# Patient Record
Sex: Male | Born: 1965 | Race: Black or African American | Hispanic: No | Marital: Single | Smoking: Former smoker
Health system: Southern US, Community
[De-identification: ages and names within clinical notes are randomized; demographics above are authoritative.]

---

## 1997-10-19 ENCOUNTER — Emergency Department (HOSPITAL_COMMUNITY): Admission: EM | Admit: 1997-10-19 | Discharge: 1997-10-19 | Payer: Self-pay | Admitting: Emergency Medicine

## 1999-12-02 ENCOUNTER — Emergency Department (HOSPITAL_COMMUNITY): Admission: EM | Admit: 1999-12-02 | Discharge: 1999-12-02 | Payer: Self-pay | Admitting: *Deleted

## 2002-11-02 ENCOUNTER — Emergency Department (HOSPITAL_COMMUNITY): Admission: EM | Admit: 2002-11-02 | Discharge: 2002-11-02 | Payer: Self-pay | Admitting: Emergency Medicine

## 2003-07-01 ENCOUNTER — Emergency Department (HOSPITAL_COMMUNITY): Admission: EM | Admit: 2003-07-01 | Discharge: 2003-07-01 | Payer: Self-pay | Admitting: Family Medicine

## 2004-01-10 ENCOUNTER — Emergency Department (HOSPITAL_COMMUNITY): Admission: EM | Admit: 2004-01-10 | Discharge: 2004-01-10 | Payer: Self-pay | Admitting: Emergency Medicine

## 2004-05-04 ENCOUNTER — Ambulatory Visit: Payer: Self-pay | Admitting: Internal Medicine

## 2004-05-04 ENCOUNTER — Inpatient Hospital Stay (HOSPITAL_COMMUNITY): Admission: EM | Admit: 2004-05-04 | Discharge: 2004-05-06 | Payer: Self-pay | Admitting: Emergency Medicine

## 2004-05-06 ENCOUNTER — Inpatient Hospital Stay (HOSPITAL_COMMUNITY): Admission: AD | Admit: 2004-05-06 | Discharge: 2004-05-09 | Payer: Self-pay | Admitting: Psychiatry

## 2004-05-06 ENCOUNTER — Ambulatory Visit: Payer: Self-pay | Admitting: Psychiatry

## 2005-03-21 ENCOUNTER — Emergency Department (HOSPITAL_COMMUNITY): Admission: EM | Admit: 2005-03-21 | Discharge: 2005-03-21 | Payer: Self-pay | Admitting: Emergency Medicine

## 2005-04-05 ENCOUNTER — Emergency Department (HOSPITAL_COMMUNITY): Admission: EM | Admit: 2005-04-05 | Discharge: 2005-04-05 | Payer: Self-pay | Admitting: Emergency Medicine

## 2005-04-09 ENCOUNTER — Emergency Department (HOSPITAL_COMMUNITY): Admission: EM | Admit: 2005-04-09 | Discharge: 2005-04-09 | Payer: Self-pay | Admitting: Emergency Medicine

## 2005-06-13 ENCOUNTER — Encounter: Payer: Self-pay | Admitting: Emergency Medicine

## 2007-08-15 ENCOUNTER — Emergency Department (HOSPITAL_COMMUNITY): Admission: EM | Admit: 2007-08-15 | Discharge: 2007-08-16 | Payer: Self-pay | Admitting: Emergency Medicine

## 2009-03-26 ENCOUNTER — Emergency Department: Payer: Self-pay | Admitting: Emergency Medicine

## 2009-04-03 ENCOUNTER — Emergency Department: Payer: Self-pay | Admitting: Unknown Physician Specialty
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
## 2010-11-09 LAB — CBC
HCT: 45.5
Hemoglobin: 15.4
MCHC: 33.8
MCV: 86.1
Platelets: 217
RBC: 5.29
RDW: 14.4
WBC: 6.6

## 2010-11-09 LAB — DIFFERENTIAL
Basophils Absolute: 0
Basophils Relative: 1
Eosinophils Relative: 3
Monocytes Absolute: 0.3
Monocytes Relative: 5

## 2010-11-09 LAB — URINALYSIS, ROUTINE W REFLEX MICROSCOPIC
Bilirubin Urine: NEGATIVE
Hgb urine dipstick: NEGATIVE
Nitrite: NEGATIVE
Protein, ur: NEGATIVE
Specific Gravity, Urine: 1.029
Urobilinogen, UA: 1

## 2010-11-09 LAB — RAPID URINE DRUG SCREEN, HOSP PERFORMED
Amphetamines: NOT DETECTED
Barbiturates: NOT DETECTED
Benzodiazepines: NOT DETECTED
Opiates: NOT DETECTED
Tetrahydrocannabinol: NOT DETECTED

## 2010-11-09 LAB — COMPREHENSIVE METABOLIC PANEL
AST: 25
Albumin: 4.6
Alkaline Phosphatase: 58
Chloride: 106
GFR calc Af Amer: >60
Potassium: 4.2
Sodium: 139
Total Bilirubin: 2.2 — ABNORMAL HIGH
Total Protein: 8

## 2010-11-09 LAB — ETHANOL: Alcohol, Ethyl (B): <5

## 2010-11-09 LAB — ACETAMINOPHEN LEVEL: Acetaminophen (Tylenol), Serum: <10 — ABNORMAL LOW

## 2014-05-04 ENCOUNTER — Emergency Department (HOSPITAL_COMMUNITY)
Admission: EM | Admit: 2014-05-04 | Discharge: 2014-05-04 | Disposition: A | Discharge status: Home / Self Care | Payer: Non-veteran care | Attending: Emergency Medicine | Admitting: Emergency Medicine

## 2014-05-04 ENCOUNTER — Emergency Department (HOSPITAL_COMMUNITY): Payer: Non-veteran care

## 2014-05-04 ENCOUNTER — Encounter (HOSPITAL_COMMUNITY): Payer: Self-pay | Admitting: Emergency Medicine

## 2014-05-04 DIAGNOSIS — S023XXA Fracture of orbital floor, initial encounter for closed fracture: Secondary | ICD-10-CM | POA: Insufficient documentation

## 2014-05-04 DIAGNOSIS — Y929 Unspecified place or not applicable: Secondary | ICD-10-CM | POA: Insufficient documentation

## 2014-05-04 DIAGNOSIS — S022XXA Fracture of nasal bones, initial encounter for closed fracture: Secondary | ICD-10-CM | POA: Insufficient documentation

## 2014-05-04 DIAGNOSIS — Y998 Other external cause status: Secondary | ICD-10-CM | POA: Insufficient documentation

## 2014-05-04 DIAGNOSIS — S0231XA Fracture of orbital floor, right side, initial encounter for closed fracture: Secondary | ICD-10-CM

## 2014-05-04 DIAGNOSIS — Z87891 Personal history of nicotine dependence: Secondary | ICD-10-CM | POA: Insufficient documentation

## 2014-05-04 DIAGNOSIS — S0011XA Contusion of right eyelid and periocular area, initial encounter: Secondary | ICD-10-CM | POA: Insufficient documentation

## 2014-05-04 DIAGNOSIS — Y9389 Activity, other specified: Secondary | ICD-10-CM | POA: Insufficient documentation

## 2014-05-04 MED ORDER — HYDROCODONE-ACETAMINOPHEN 5-325 MG PO TABS: 1.0000 | ORAL_TABLET | ORAL | Status: AC | PRN

## 2014-05-04 MED ORDER — AMOXICILLIN-POT CLAVULANATE 875-125 MG PO TABS: 1.0000 | ORAL_TABLET | Freq: Two times a day (BID) | ORAL | Status: AC

## 2014-05-04 NOTE — ED Notes (Signed)
Pt requested OJ on ice and was given the same
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
## 2014-05-04 NOTE — Discharge Instructions (Signed)
Take Augmentin until gone. Take Vicodin as needed for pain. Follow up with the recommended Ophthalmologist and ENT doctor. Refer to attached documents for more information.
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
## 2019-09-18 ENCOUNTER — Encounter (HOSPITAL_COMMUNITY): Payer: Self-pay

## 2019-09-18 ENCOUNTER — Other Ambulatory Visit: Payer: Self-pay

## 2019-09-18 ENCOUNTER — Emergency Department (HOSPITAL_COMMUNITY)
Admission: EM | Admit: 2019-09-18 | Discharge: 2019-09-18 | Disposition: A | Discharge status: Home / Self Care | Payer: No Typology Code available for payment source | Attending: Emergency Medicine | Admitting: Emergency Medicine

## 2019-09-18 DIAGNOSIS — R319 Hematuria, unspecified: Secondary | ICD-10-CM | POA: Insufficient documentation

## 2019-09-18 DIAGNOSIS — Z5321 Procedure and treatment not carried out due to patient leaving prior to being seen by health care provider: Secondary | ICD-10-CM | POA: Insufficient documentation

## 2019-09-18 LAB — URINALYSIS, ROUTINE W REFLEX MICROSCOPIC
Bilirubin Urine: NEGATIVE
Glucose, UA: NEGATIVE mg/dL
Ketones, ur: NEGATIVE mg/dL
Nitrite: NEGATIVE
Protein, ur: NEGATIVE mg/dL
Specific Gravity, Urine: 1.019 (ref 1.005–1.030)
WBC, UA: >50 WBC/hpf — ABNORMAL HIGH (ref 0–5)
pH: 6 (ref 5.0–8.0)

## 2019-09-18 NOTE — ED Triage Notes (Addendum)
Pt arrives to ED w/ c/o hematuria that started on Sunday. Pt denies back/flank pain. Pt denies injury/trauma to genitals.

## 2019-09-18 NOTE — ED Notes (Signed)
Pt left due to not being seen quick enough 

## 2019-09-20 ENCOUNTER — Other Ambulatory Visit: Payer: Self-pay

## 2019-09-20 ENCOUNTER — Encounter (HOSPITAL_COMMUNITY): Payer: Self-pay

## 2019-09-20 ENCOUNTER — Ambulatory Visit (HOSPITAL_COMMUNITY)
Admission: EM | Admit: 2019-09-20 | Discharge: 2019-09-20 | Disposition: A | Discharge status: Home / Self Care | Payer: Medicare Other | Attending: Emergency Medicine | Admitting: Emergency Medicine

## 2019-09-20 DIAGNOSIS — N341 Nonspecific urethritis: Secondary | ICD-10-CM | POA: Diagnosis not present

## 2019-09-20 DIAGNOSIS — R319 Hematuria, unspecified: Secondary | ICD-10-CM | POA: Diagnosis not present

## 2019-09-20 DIAGNOSIS — J029 Acute pharyngitis, unspecified: Secondary | ICD-10-CM | POA: Diagnosis not present

## 2019-09-20 LAB — POCT URINALYSIS DIPSTICK, ED / UC
Glucose, UA: NEGATIVE mg/dL
Nitrite: NEGATIVE
Protein, ur: 30 mg/dL — AB
Specific Gravity, Urine: >=1.03 (ref 1.005–1.030)
Urobilinogen, UA: 0.2 mg/dL (ref 0.0–1.0)
pH: 5 (ref 5.0–8.0)

## 2019-09-20 LAB — URINE CULTURE: Culture: NO GROWTH

## 2019-09-20 LAB — POCT RAPID STREP A, ED / UC: Streptococcus, Group A Screen (Direct): NEGATIVE

## 2019-09-20 MED ORDER — CEFTRIAXONE SODIUM 1 G IJ SOLR
1.0000 g | Freq: Once | INTRAMUSCULAR | Status: AC
  Administered 2019-09-20: 1 g via INTRAMUSCULAR

## 2019-09-20 MED ORDER — LIDOCAINE HCL (PF) 1 % IJ SOLN
INTRAMUSCULAR | Status: AC
  Filled 2019-09-20: qty 2

## 2019-09-20 MED ORDER — AZITHROMYCIN 250 MG PO TABS
ORAL_TABLET | ORAL | Status: AC
Start: 2019-09-20 — End: ?
  Filled 2019-09-20: qty 4

## 2019-09-20 MED ORDER — CEFTRIAXONE SODIUM 1 G IJ SOLR
INTRAMUSCULAR | Status: AC
  Filled 2019-09-20: qty 10

## 2019-09-20 MED ORDER — AZITHROMYCIN 250 MG PO TABS
1000.0000 mg | ORAL_TABLET | Freq: Once | ORAL | Status: AC
  Administered 2019-09-20: 1000 mg via ORAL

## 2019-09-20 MED ORDER — MAGIC MOUTHWASH W/LIDOCAINE: 5.0000 mL | Freq: Four times a day (QID) | ORAL | 0 refills | Status: AC | PRN

## 2019-09-20 NOTE — ED Provider Notes (Signed)
[facility]    CSN: [id number] Arrival date & time: 09/20/19  1337      History   Chief Complaint Chief Complaint  Patient presents with  . Sore Throat  . Hematuria    HPI [name] is a 54 y.o. male. He reports sore throat that began about 3 days ago. Pain is right sided. Reports painful to swallow. Pain radiates up to her right ear and right neck. Denies fever, chills, change in voices, difficulty with breathing.   Also reports Hematuria for 1 weeks. Reports noting bright red blood in his urine and sometimes at the end of urination. Also noted purulent urethral discharge 3 days ago. Uncertain if he has had a positive STD contact   HPI  History reviewed. No pertinent past medical history.  There are no problems to display for this patient.   History reviewed. No pertinent surgical history.     Home Medications    Prior to Admission medications   Medication Sig Start Date End Date Taking? Authorizing Provider  amoxicillin-clavulanate (AUGMENTIN) 875-125 MG per tablet Take 1 tablet by mouth every 12 (twelve) hours. 05/04/14   Emilia Beck, PA-C  HYDROcodone-acetaminophen (NORCO/VICODIN) 5-325 MG per tablet Take 1-2 tablets by mouth every 4 (four) hours as needed. 05/04/14   Emilia Beck, PA-C  magic mouthwash w/lidocaine SOLN Take 5 mLs by mouth 4 (four) times daily as needed for mouth pain. 09/20/19   Chilton Si, PA-C    Family History History reviewed. No pertinent family history.  Social History Social History   Tobacco Use  . Smoking status: Former Smoker  Substance Use Topics  . Alcohol use: No  . Drug use: No     Allergies   Patient has no known allergies.   Review of Systems Review of Systems  Constitutional: Negative for chills and fever.  HENT: Positive for sore throat and trouble swallowing. Negative for postnasal drip, sinus pain and voice change.   Respiratory: Negative for cough, shortness of breath and  wheezing.   Cardiovascular: Negative for chest pain.  Gastrointestinal: Negative for abdominal pain, nausea and vomiting.  Genitourinary: Positive for discharge, dysuria, frequency and hematuria. Negative for difficulty urinating and penile pain.  Musculoskeletal: Negative for myalgias, neck pain and neck stiffness.  Neurological: Negative for dizziness and headaches.  All other systems reviewed and are negative.    Physical Exam Triage Vital Signs ED Triage Vitals  Enc Vitals Group     BP 09/20/19 1518 (!) 137/92     Pulse Rate 09/20/19 1518 64     Resp 09/20/19 1518 16     Temp 09/20/19 1518 98.9 F (37.2 C)     Temp Source 09/20/19 1518 Oral     SpO2 09/20/19 1518 100 %     Weight --      Height --      Head Circumference --      Peak Flow --      Pain Score 09/20/19 1521 8     Pain Loc --      Pain Edu? --      Excl. in GC? --    No data found.  Updated Vital Signs BP (!) 137/92 (BP Location: Right Arm)   Pulse 64   Temp 98.9 F (37.2 C) (Oral)   Resp 16   SpO2 100%   Visual Acuity Right Eye Distance:   Left Eye Distance:   Bilateral Distance:    Right Eye Near:  Left Eye Near:    Bilateral Near:     Physical Exam Vitals reviewed.  Constitutional:      General: He is not in acute distress.    Appearance: He is well-developed and normal weight. He is not ill-appearing.  HENT:     Head: Normocephalic and atraumatic.     Right Ear: Tympanic membrane normal. There is impacted cerumen.     Left Ear: Tympanic membrane normal. There is impacted cerumen.     Nose: No congestion or rhinorrhea.     Mouth/Throat:     Mouth: Mucous membranes are moist.     Pharynx: No oropharyngeal exudate or posterior oropharyngeal erythema.     Tonsils: No tonsillar exudate or tonsillar abscesses.  Eyes:     Extraocular Movements:     Right eye: Normal extraocular motion.     Left eye: Normal extraocular motion.     Conjunctiva/sclera: Conjunctivae normal.     Pupils:  Pupils are equal, round, and reactive to light.  Neck:     Thyroid: No thyromegaly.  Cardiovascular:     Rate and Rhythm: Normal rate and regular rhythm.     Heart sounds: Normal heart sounds.  Pulmonary:     Effort: Pulmonary effort is normal.     Breath sounds: Normal breath sounds.  Abdominal:     General: Bowel sounds are normal. There is no distension.     Palpations: Abdomen is soft. There is no mass.  Musculoskeletal:     Cervical back: Normal range of motion and neck supple.  Lymphadenopathy:     Cervical: Cervical adenopathy present.  Skin:    General: Skin is warm.     Coloration: Skin is not pale.  Neurological:     Mental Status: He is alert.  Psychiatric:        Mood and Affect: Mood normal.        Behavior: Behavior normal.      UC Treatments / Results  Labs (all labs ordered are listed, but only abnormal results are displayed) Labs Reviewed  POCT URINALYSIS DIPSTICK, ED / UC - Abnormal; Notable for the following components:      Result Value   Bilirubin Urine SMALL (*)    Ketones, ur TRACE (*)    Hgb urine dipstick SMALL (*)    Protein, ur 30 (*)    Leukocytes,Ua SMALL (*)    All other components within normal limits  CULTURE, GROUP A STREP [facility])  POCT RAPID STREP A, ED / UC  CYTOLOGY, (ORAL, ANAL, URETHRAL) ANCILLARY ONLY    EKG   Radiology No results found.  Procedures Procedures (including critical care time)  Medications Ordered in UC Medications  azithromycin (ZITHROMAX) tablet 1,000 mg (has no administration in time range)  cefTRIAXone (ROCEPHIN) injection 1 g (has no administration in time range)    Initial Impression / Assessment and Plan / UC Course  I have reviewed the triage vital signs and the nursing notes.  Pertinent labs & imaging results that were available during my care of the patient were reviewed by me and considered in my medical decision making (see chart for details).     Pharyngitis and Urethritis:Symptoms and  exam consistent with urethritis. I treated him with azithromax and rocephin. Advised this will also help against a bacterial pharyngitis as well . Advised if he develops shortness of breath he should go immediately to the ER to ensure no tonsillar abscess. Pt voices understanding and agrees with the plan  Final Clinical Impressions(s) / UC Diagnoses   Final diagnoses:  Pharyngitis, unspecified etiology  Urethritis, nonspecific     Discharge Instructions     We will call you with the lab results. You have been treated with rocephin and azithromycin today. This should help your sore throat as well. If your symptoms worsen please f/u with the ER for further evaluation    ED Prescriptions    Medication Sig Dispense Auth. Provider   magic mouthwash w/lidocaine SOLN Take 5 mLs by mouth 4 (four) times daily as needed for mouth pain. 200 mL Chilton Si, PA-C     PDMP not reviewed this encounter.   Chilton Si, PA-C 09/20/19 1720

## 2019-09-20 NOTE — Discharge Instructions (Addendum)
We will call you with the lab results. You have been treated with rocephin and azithromycin today. This should help your sore throat as well. If your symptoms worsen please f/u with the ER for further evaluation

## 2019-09-20 NOTE — ED Triage Notes (Signed)
Pt presents to UC for subjective swollen neck, and possible swollen lymph nodes. Pt states it is painful to swallow. Pt states symptoms have been present x 3 days.   Pt also states he has had blood in his urine x7 days. Pt denies painful urination, burning, or lower abdominal pain.   Pt denies fevers, chills, n/v/d, cough, loss of taste or smell, headache, body aches, nasal congestion.

## 2019-09-21 LAB — CYTOLOGY, (ORAL, ANAL, URETHRAL) ANCILLARY ONLY
Chlamydia: NEGATIVE
Comment: NEGATIVE
Comment: NEGATIVE
Comment: NORMAL
Neisseria Gonorrhea: POSITIVE — AB
Trichomonas: POSITIVE — AB

## 2019-09-23 ENCOUNTER — Telehealth (HOSPITAL_COMMUNITY): Payer: Self-pay

## 2019-09-23 DIAGNOSIS — A599 Trichomoniasis, unspecified: Secondary | ICD-10-CM

## 2019-09-23 LAB — CULTURE, GROUP A STREP (THRC)

## 2019-09-23 MED ORDER — METRONIDAZOLE 500 MG PO TABS: 500.0000 mg | ORAL_TABLET | Freq: Two times a day (BID) | ORAL | 0 refills | Status: AC

## 2019-09-23 NOTE — Telephone Encounter (Signed)
Patient contacted by phone and made aware of    results. Pt verbalized understanding and had all questions answered.   Test for gonorrhea was positive. This was treated at the urgent care visit with IM rocephin 500mg . Test for trichomonas was positive.  Flagyl 500mg  BID  x 7 days sent to pharmacy of choice.  Please refrain from sexual intercourse for 7 days after treatment to give the medicine time to work. Sexual partners need to be notified and tested/treated. Condoms may reduce risk of reinfection. Recheck or followup with PCP for further evaluation if symptoms are not improving. [facility] notified.

## 2019-09-24 ENCOUNTER — Encounter (HOSPITAL_COMMUNITY): Payer: Self-pay

## 2019-09-24 ENCOUNTER — Emergency Department (HOSPITAL_COMMUNITY): Payer: No Typology Code available for payment source

## 2019-09-24 ENCOUNTER — Ambulatory Visit (HOSPITAL_COMMUNITY)
Admission: EM | Admit: 2019-09-24 | Discharge: 2019-09-24 | Disposition: A | Discharge status: Home / Self Care | Payer: No Typology Code available for payment source | Attending: Family Medicine | Admitting: Family Medicine

## 2019-09-24 ENCOUNTER — Other Ambulatory Visit: Payer: Self-pay

## 2019-09-24 ENCOUNTER — Encounter (HOSPITAL_COMMUNITY): Payer: Self-pay | Admitting: Emergency Medicine

## 2019-09-24 ENCOUNTER — Emergency Department (HOSPITAL_COMMUNITY)
Admission: EM | Admit: 2019-09-24 | Discharge: 2019-09-25 | Disposition: A | Discharge status: Home / Self Care | Payer: No Typology Code available for payment source | Attending: Emergency Medicine | Admitting: Emergency Medicine

## 2019-09-24 DIAGNOSIS — Z87891 Personal history of nicotine dependence: Secondary | ICD-10-CM | POA: Insufficient documentation

## 2019-09-24 DIAGNOSIS — J36 Peritonsillar abscess: Secondary | ICD-10-CM

## 2019-09-24 DIAGNOSIS — J029 Acute pharyngitis, unspecified: Secondary | ICD-10-CM | POA: Diagnosis present

## 2019-09-24 DIAGNOSIS — L539 Erythematous condition, unspecified: Secondary | ICD-10-CM | POA: Diagnosis not present

## 2019-09-24 LAB — CBC WITH DIFFERENTIAL/PLATELET
Abs Immature Granulocytes: 0.04 10*3/uL (ref 0.00–0.07)
Basophils Absolute: 0.1 10*3/uL (ref 0.0–0.1)
Basophils Relative: 1 %
Eosinophils Absolute: 0.1 10*3/uL (ref 0.0–0.5)
Eosinophils Relative: 0 %
HCT: 46.7 % (ref 39.0–52.0)
Hemoglobin: 14 g/dL (ref 13.0–17.0)
Immature Granulocytes: 0 %
Lymphocytes Relative: 11 %
Lymphs Abs: 1.3 10*3/uL (ref 0.7–4.0)
MCH: 27.5 pg (ref 26.0–34.0)
MCHC: 30 g/dL (ref 30.0–36.0)
MCV: 91.7 fL (ref 80.0–100.0)
Monocytes Absolute: 1.1 10*3/uL — ABNORMAL HIGH (ref 0.1–1.0)
Monocytes Relative: 9 %
Neutro Abs: 9.7 10*3/uL — ABNORMAL HIGH (ref 1.7–7.7)
Neutrophils Relative %: 79 %
Platelets: 346 10*3/uL (ref 150–400)
RBC: 5.09 MIL/uL (ref 4.22–5.81)
RDW: 13.5 % (ref 11.5–15.5)
WBC: 12.2 10*3/uL — ABNORMAL HIGH (ref 4.0–10.5)
nRBC: 0 % (ref 0.0–0.2)

## 2019-09-24 LAB — I-STAT CHEM 8, ED
BUN: 15 mg/dL (ref 6–20)
Calcium, Ion: 1.1 mmol/L — ABNORMAL LOW (ref 1.15–1.40)
Chloride: 105 mmol/L (ref 98–111)
Creatinine, Ser: 0.8 mg/dL (ref 0.61–1.24)
Glucose, Bld: 122 mg/dL — ABNORMAL HIGH (ref 70–99)
HCT: 43 % (ref 39.0–52.0)
Hemoglobin: 14.6 g/dL (ref 13.0–17.0)
Potassium: 3.6 mmol/L (ref 3.5–5.1)
Sodium: 139 mmol/L (ref 135–145)
TCO2: 24 mmol/L (ref 22–32)

## 2019-09-24 MED ORDER — SODIUM CHLORIDE 0.9 % IV BOLUS
1000.0000 mL | Freq: Once | INTRAVENOUS | Status: AC
  Administered 2019-09-24: 1000 mL via INTRAVENOUS

## 2019-09-24 MED ORDER — CLINDAMYCIN HCL 300 MG PO CAPS: 300.0000 mg | ORAL_CAPSULE | Freq: Three times a day (TID) | ORAL | 0 refills | Status: AC

## 2019-09-24 MED ORDER — SODIUM CHLORIDE 0.9 % IV SOLN: 2.0000 g | Freq: Once | INTRAVENOUS | Status: DC

## 2019-09-24 MED ORDER — DEXAMETHASONE SODIUM PHOSPHATE 10 MG/ML IJ SOLN
10.0000 mg | Freq: Once | INTRAMUSCULAR | Status: AC
  Administered 2019-09-24: 10 mg via INTRAVENOUS
  Filled 2019-09-24: qty 1

## 2019-09-24 MED ORDER — IOHEXOL 300 MG/ML  SOLN
75.0000 mL | Freq: Once | INTRAMUSCULAR | Status: AC | PRN
  Administered 2019-09-24: 75 mL via INTRAVENOUS

## 2019-09-24 MED ORDER — CLINDAMYCIN PHOSPHATE 900 MG/50ML IV SOLN
900.0000 mg | Freq: Once | INTRAVENOUS | Status: AC
  Administered 2019-09-24: 900 mg via INTRAVENOUS
  Filled 2019-09-24: qty 50

## 2019-09-24 NOTE — Discharge Instructions (Addendum)
It was our pleasure to provide your ER care today - we hope that you feel better.  We discussed your case with our ENT doctor on call, Dr Pollyann Kennedy - he indicates for you to follow up with them in  the office this morning - call office at 8 AM to arrange time of appointment - when you call, tell them that you were in the ER tonight, and that we discussed your case with Dr Pollyann Kennedy and that he wants you to be seen in the office this AM.   You will need to continue to take the antibiotics that were prescribed to you at Urgent care.  You will also need to start a new antibiotic tomorrow and will need to continue this until completed.  You can take Tylenol for discomfort as needed.  Return to ER right away if worse, unable to swallow, trouble breathing, or other concern.

## 2019-09-24 NOTE — ED Provider Notes (Signed)
[facility]    CSN: [id number] Arrival date & time: 09/24/19  1047      History   Chief Complaint Chief Complaint  Patient presents with  . Oral Swelling    HPI [name] is a 54 y.o. male.   HPI  Patient has a persistent sore throat and states that he can hardly swallow. He is unable to swallow his Augmentin Side of his face and throat are swelling  History reviewed. No pertinent past medical history.  There are no problems to display for this patient.   History reviewed. No pertinent surgical history.     Home Medications    Prior to Admission medications   Medication Sig Start Date End Date Taking? Authorizing Provider  amoxicillin-clavulanate (AUGMENTIN) 875-125 MG per tablet Take 1 tablet by mouth every 12 (twelve) hours. 05/04/14   Emilia Beck, PA-C  HYDROcodone-acetaminophen (NORCO/VICODIN) 5-325 MG per tablet Take 1-2 tablets by mouth every 4 (four) hours as needed. 05/04/14   Emilia Beck, PA-C  magic mouthwash w/lidocaine SOLN Take 5 mLs by mouth 4 (four) times daily as needed for mouth pain. 09/20/19   Chilton Si, PA-C  metroNIDAZOLE (FLAGYL) 500 MG tablet Take 1 tablet (500 mg total) by mouth 2 (two) times daily. 09/23/19   LampteyBritta Mccreedy, MD    Family History Family History  Problem Relation Age of Onset  . Healthy Mother   . Healthy Father     Social History Social History   Tobacco Use  . Smoking status: Former Smoker  Substance Use Topics  . Alcohol use: Yes    Comment: ocassional   . Drug use: No     Allergies   Patient has no known allergies.   Review of Systems Review of Systems  See HPI Physical Exam Triage Vital Signs ED Triage Vitals  Enc Vitals Group     BP 09/24/19 1230 138/74     Pulse Rate 09/24/19 1230 64     Resp 09/24/19 1230 17     Temp 09/24/19 1230 98.4 F (36.9 C)     Temp Source 09/24/19 1230 Oral     SpO2 09/24/19 1230 99 %     Weight --      Height --      Head  Circumference --      Peak Flow --      Pain Score 09/24/19 1227 8     Pain Loc --      Pain Edu? --      Excl. in GC? --    No data found.  Updated Vital Signs BP 138/74 (BP Location: Left Arm)   Pulse 64   Temp 98.4 F (36.9 C) (Oral)   Resp 17   SpO2 99%      Physical Exam Patient appears sick.  He has a muffled voice.  He can only open his mouth a couple centimeters at the incisor.  Posterior pharynx difficult to visualize, however, it is clear that the right tonsillar pillar is enlarged and the uvula is pushed over.  He appears to have a peritonsillar abscess.  Definite adenopathy.  Significant pain.  UC Treatments / Results  Labs (all labs ordered are listed, but only abnormal results are displayed) Labs Reviewed - No data to display  EKG   Radiology No results found.  Procedures Procedures (including critical care time)  Medications Ordered in UC Medications - No data to display  Initial Impression / Assessment and Plan /  UC Course  I have reviewed the triage vital signs and the nursing notes.  Pertinent labs & imaging results that were available during my care of the patient were reviewed by me and considered in my medical decision making (see chart for details).     Explained to the patient that I cannot treat him at the urgent care center for a peritonsillar abscess. Final Clinical Impressions(s) / UC Diagnoses   Final diagnoses:  Tonsillar abscess     Discharge Instructions     Need to go to the ER for care   ED Prescriptions    None     PDMP not reviewed this encounter.   Eustace Moore, MD 09/24/19 2214

## 2019-09-24 NOTE — ED Provider Notes (Signed)
err   Dana Allan, MD 09/28/19 1655    Cathren Laine, MD 09/30/19 1432

## 2019-09-24 NOTE — ED Notes (Signed)
Called pt name x4 for VS recheck. No response from pt.  

## 2019-09-24 NOTE — ED Triage Notes (Signed)
Pt presents for follow-up after being seen on 09/20/19 for swelling in mouth and neck. States he is unable to chew and swallow without pain. States right side is worse.   RX for antibiotic and mouthwash was sent to pharmacy. Pt states he has had one dose of antibiotic, but unable to pick up RX for mouthwash.

## 2019-09-24 NOTE — Discharge Instructions (Signed)
Need to go to the ER for care

## 2019-09-24 NOTE — ED Triage Notes (Addendum)
Pt arrives to ED from UC w/ c/o abscess on tonsils x 1 week. Pt reports 9/10 pain. Resp e/u. Pt denies fever. Pt seen previously and given rx of abx but was not able to get it filled until today.

## 2019-09-24 NOTE — ED Notes (Signed)
Pt transported to CT ?

## 2019-09-24 NOTE — ED Provider Notes (Signed)
[facility] EMERGENCY DEPARTMENT Provider Note   CSN: [id number] Arrival date & time: 09/24/19  1419     History Chief Complaint  Patient presents with  . Abscess    [name] is a 54 y.o. male.  Patient presents to ED for sore throat.  Was seen at urgent care 08/06 and treated with Zithromax and Rocephin for peritonsillar abscess and urethritis. He was given a script for Flagyl and mouthwash.  Reports that he was unable to get mouthwash as was expensive.  He reports taking Flagyl today.  Denies any fevers, SOB or headaches.  Endorses right ear pain, difficulty swallowing and right side neck swelling.  He reports that symptoms have not gotten worse since last seen by provider.     Abscess Associated symptoms: no fever and no headaches        History reviewed. No pertinent past medical history.  There are no problems to display for this patient.   History reviewed. No pertinent surgical history.     Family History  Problem Relation Age of Onset  . Healthy Mother   . Healthy Father     Social History   Tobacco Use  . Smoking status: Former Smoker  Substance Use Topics  . Alcohol use: Yes    Comment: ocassional   . Drug use: No    Home Medications Prior to Admission medications   Medication Sig Start Date End Date Taking? Authorizing Provider  amoxicillin-clavulanate (AUGMENTIN) 875-125 MG per tablet Take 1 tablet by mouth every 12 (twelve) hours. 05/04/14   Emilia Beck, PA-C  HYDROcodone-acetaminophen (NORCO/VICODIN) 5-325 MG per tablet Take 1-2 tablets by mouth every 4 (four) hours as needed. 05/04/14   Emilia Beck, PA-C  magic mouthwash w/lidocaine SOLN Take 5 mLs by mouth 4 (four) times daily as needed for mouth pain. 09/20/19   Chilton Si, PA-C  metroNIDAZOLE (FLAGYL) 500 MG tablet Take 1 tablet (500 mg total) by mouth 2 (two) times daily. 09/23/19   Lamptey, Britta Mccreedy, MD    Allergies    Patient has no known  allergies.  Review of Systems   Review of Systems  Constitutional: Negative for chills and fever.  HENT: Positive for facial swelling, sore throat and trouble swallowing. Negative for drooling.   Respiratory: Negative for shortness of breath, wheezing and stridor.   Cardiovascular: Negative for chest pain.  Neurological: Negative for dizziness and headaches.    Physical Exam Updated Vital Signs BP 131/72 (BP Location: Right Arm)   Pulse 63   Temp 98.7 F (37.1 C) (Oral)   Resp 16   SpO2 100%   Physical Exam Constitutional:      Appearance: Normal appearance.  HENT:     Head: Normocephalic.     Right Ear: Ear canal and external ear normal. There is impacted cerumen.     Left Ear: Ear canal and external ear normal. There is impacted cerumen.     Mouth/Throat:     Mouth: Mucous membranes are moist.     Pharynx: Posterior oropharyngeal erythema present. No oropharyngeal exudate.  Eyes:     Extraocular Movements: Extraocular movements intact.     Pupils: Pupils are equal, round, and reactive to light.  Neck:     Comments: Right side edema Cardiovascular:     Rate and Rhythm: Regular rhythm.     Pulses: Normal pulses.     Heart sounds: Normal heart sounds.  Pulmonary:     Effort: Pulmonary effort is normal.  Breath sounds: Normal breath sounds.  Musculoskeletal:     Cervical back: Normal range of motion. Tenderness present.  Skin:    General: Skin is warm.     Capillary Refill: Capillary refill takes less than 2 seconds.  Neurological:     Mental Status: He is alert and oriented to person, place, and time.     ED Results / Procedures / Treatments   Labs (all labs ordered are listed, but only abnormal results are displayed) Labs Reviewed  CBC WITH DIFFERENTIAL/PLATELET - Abnormal; Notable for the following components:      Result Value   WBC 12.2 (*)    Neutro Abs 9.7 (*)    Monocytes Absolute 1.1 (*)    All other components within normal limits  I-STAT CHEM  8, ED - Abnormal; Notable for the following components:   Glucose, Bld 122 (*)    Calcium, Ion 1.10 (*)    All other components within normal limits    EKG None  Radiology No results found.  Procedures Procedures (including critical care time)  Medications Ordered in ED Medications  sodium chloride 0.9 % bolus 1,000 mL (0 mLs Intravenous Stopped 09/24/19 2145)  dexamethasone (DECADRON) injection 10 mg (10 mg Intravenous Given 09/24/19 2034)  clindamycin (CLEOCIN) IVPB 900 mg ( Intravenous Stopped 09/24/19 2034)    ED Course  I have reviewed the triage vital signs and the nursing notes.  Pertinent labs & imaging results that were available during my care of the patient were reviewed by me and considered in my medical decision making (see chart for details).     MDM Rules/Calculators/A&P                          [name] is a 54 y.o. male who presents to the ED with right peritonsillar abscess.  No fevers but difficulty swallowing along with right ear pain.  Exam significant for right side neck edema and cervical lymphadenopathy.  Right tonsil erythematous and enlarged without obvious exudate.  Labs significant for mild leukocytosis.  Will give NaCl 1L bolus, Decadron 10 mg and Clindamycin 600mg  IV. Spoke with ENT, Dr and recommends CT neck to rule out neck abscess.  If no abscess can follow up outpatient ENT in am and continue Clindamycin po.  Final Clinical Impression(s) / ED Diagnoses Final diagnoses:  Peritonsillar abscess    Rx / DC Orders ED Discharge Orders    None       Pollyann Kennedy, MD 09/24/19 2248    11/24/19, MD 09/24/19 11/24/19    Lamar Blinks, MD 09/24/19 2325

## 2021-10-05 IMAGING — CT CT NECK W/ CM
4 of 6 series · 13 of 33 positions shown, 15 images · IV contrast (APPLIED)
Comparison: None.

CLINICAL DATA: Initial evaluation for acute right-sided sore
throat.

EXAM:
CT NECK WITH CONTRAST
TECHNIQUE: Multidetector CT imaging of the neck was performed using the
standard protocol following the bolus administration of intravenous
contrast.
CONTRAST:  75mL OMNIPAQUE IOHEXOL 300 MG/ML  SOLN

[Series 3: axial neck · axial · 0.44mm/px · z∈[-171,-25]mm · 3 of 147 slices shown, 4 images]
[im 37/147  soft-tissue]
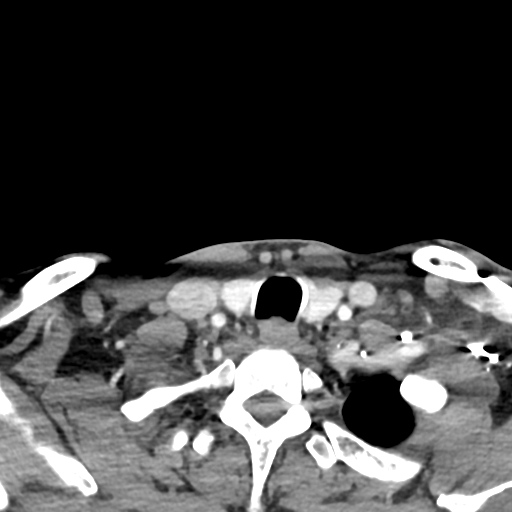
[im 37/147  bone]
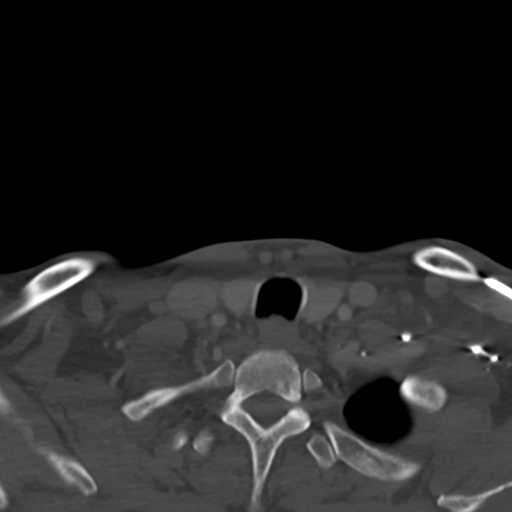
[im 74/147  bone]
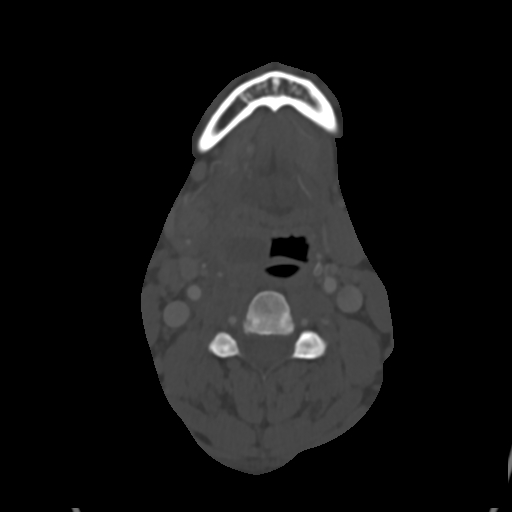
[im 110/147  bone]
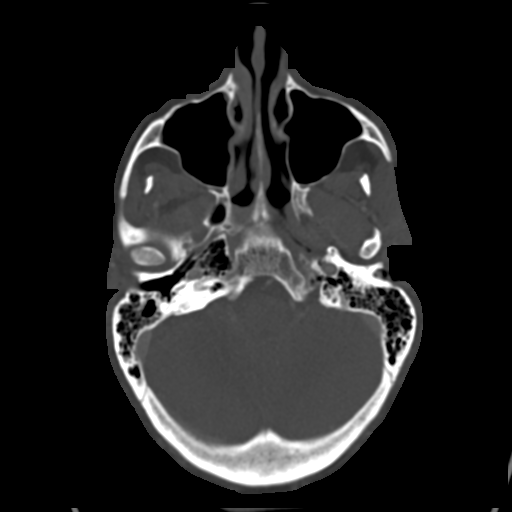

[Series 4: axial bone · axial · 0.44mm/px · z∈[-147,-49]mm · 2 of 147 slices shown]
[im 49/147  bone]
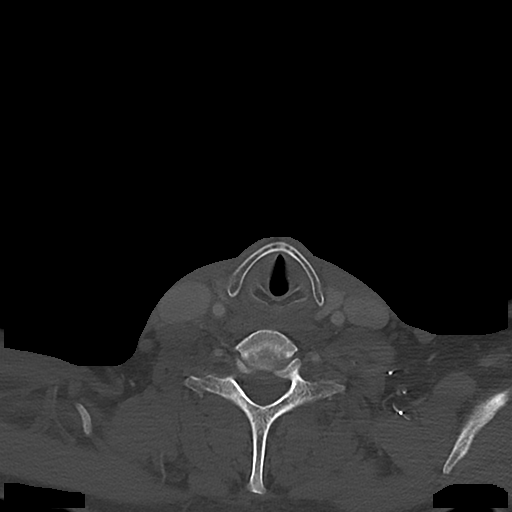
[im 98/147  bone]
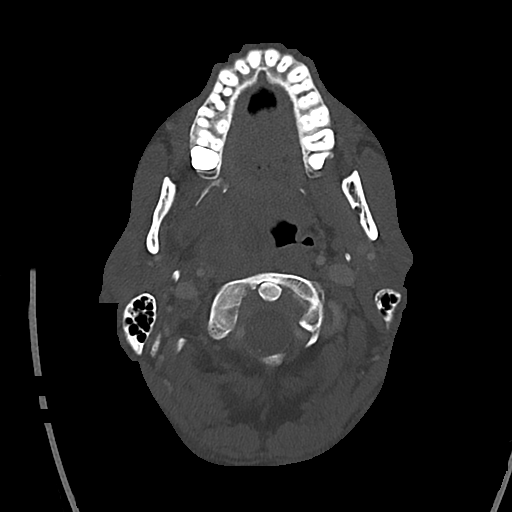

[Series 6: sag neck · sagittal · 0.45mm/px · 5 of 105 slices shown, 6 images]
[im 35/105  bone]
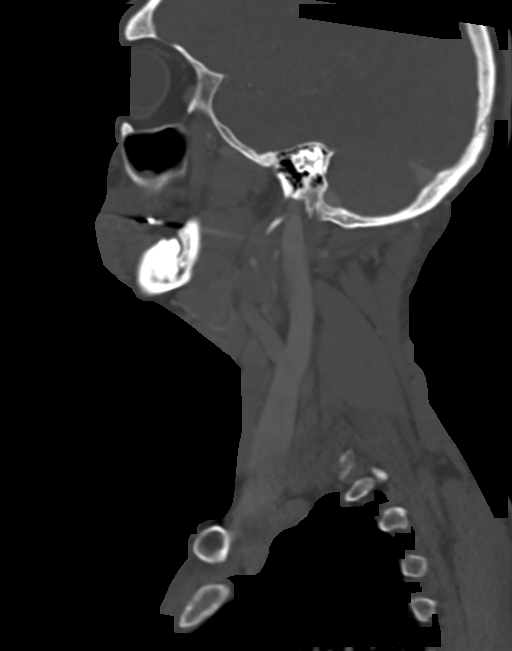
[im 44/105  bone]
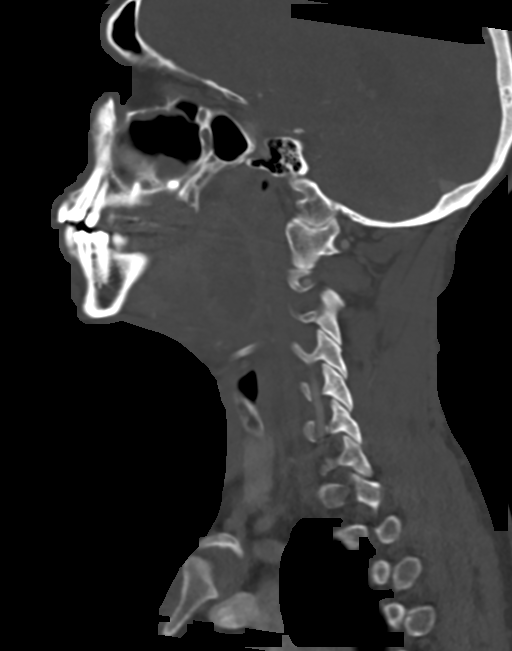
[im 53/105  soft-tissue]
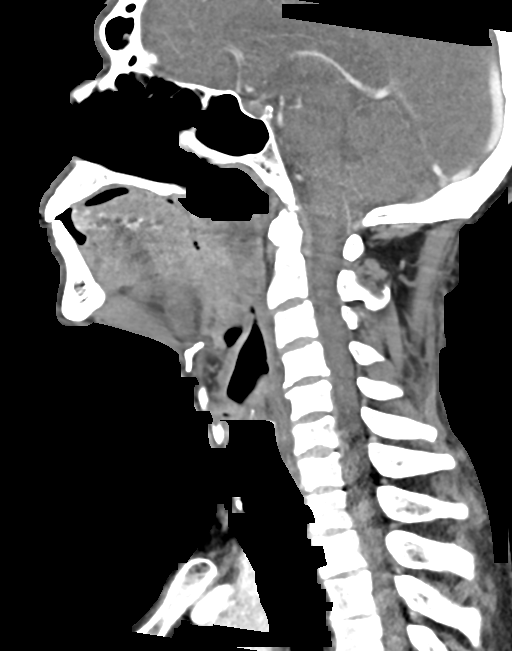
[im 53/105  bone]
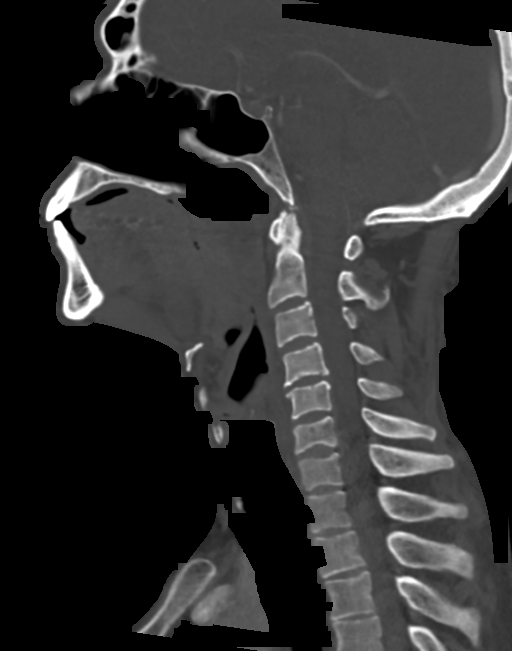
[im 61/105  bone]
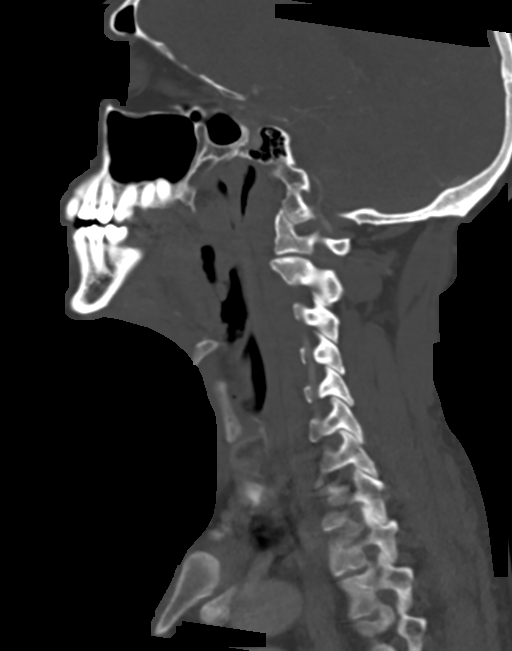
[im 70/105  bone]
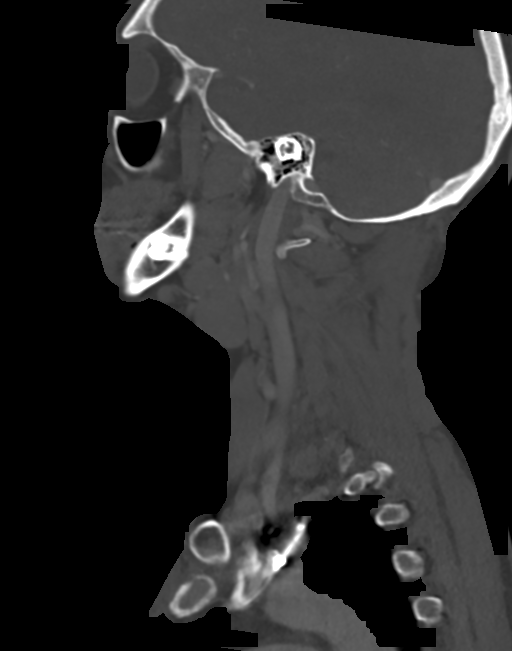

[Series 7: cor neck · coronal · 0.48mm/px · 3 of 104 slices shown]
[im 21/104  bone]
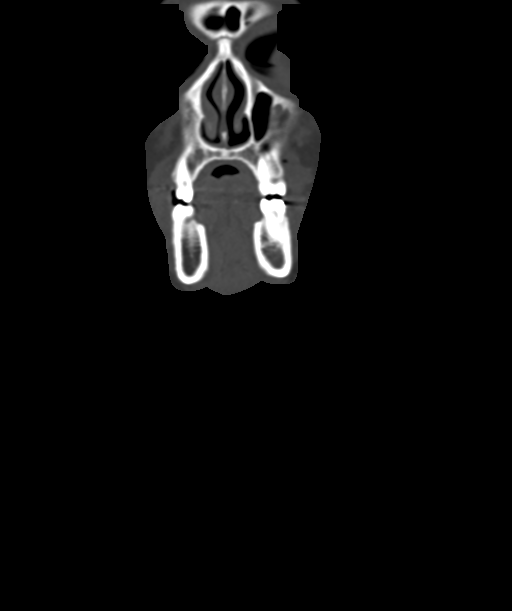
[im 42/104  bone]
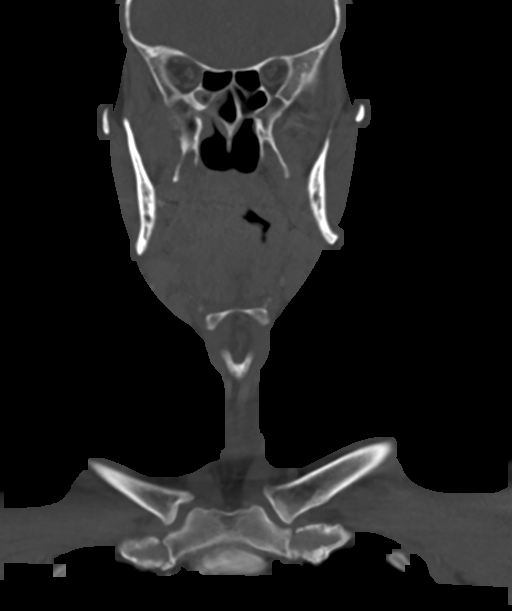
[im 62/104  bone]
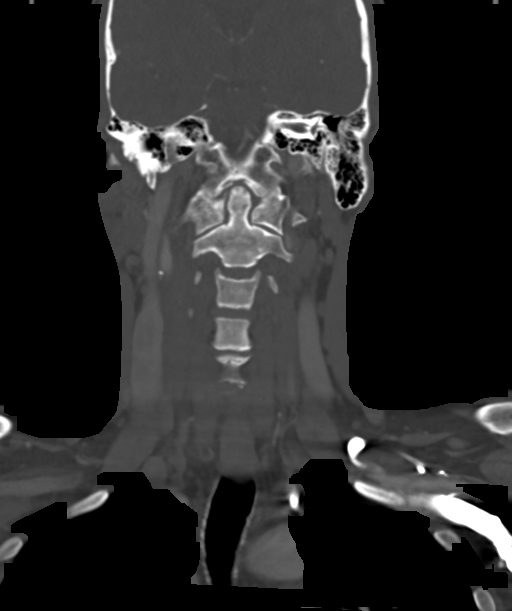

[13 of 33 positions shown; findings below may reference images not displayed]

FINDINGS: Pharynx and larynx: Oral cavity within normal limits. No acute
abnormality about the dentition. Asymmetric enlargement of the right
palatine tonsil, suggesting acute tonsillitis. Superimposed large
hypodense rim enhancing collection measures 2.1 x 3.4 x 5.7 cm
consistent with tonsillar/peritonsillar abscess (series 3, image
60). The right tonsil is medialized towards the midline, abutting
the uvula. Associated swelling with asymmetric fullness within the
right nasopharynx and oropharynx, consistent with associated
pharyngitis. Inflammatory stranding noted within the adjacent right
parapharyngeal space. The abscess extends inferiorly within the
right pharynx to nearly the level of the piriform sinus (series 7,
image 49). Secondary mass effect on the supraglottic airway which is
narrowed but remains patent at this time. No retropharyngeal
collection. Epiglottis within normal limits. Vallecula clear.
Remainder of the hypopharynx and supraglottic larynx within normal
limits. True cords symmetric and normal. Subglottic airway clear.

Salivary glands: Salivary glands including the parotid and
submandibular glands are within normal limits.

Thyroid: Normal.

Lymph nodes: Prominent right level IB nodes measure up to 7 mm.
Right level 2 nodes measure up to 9 mm. These are presumably
reactive. No other pathologically enlarged lymph nodes identified
within the neck.

Vascular: Normal intravascular enhancement seen throughout the neck.
Mild atheromatous change about the carotid bifurcations.

Limited intracranial: Unremarkable.

Visualized orbits: Unremarkable.

Mastoids and visualized paranasal sinuses: Scattered mucosal
thickening noted within the ethmoidal air cells and right greater
than left maxillary sinuses. Paranasal sinuses are otherwise clear.
Trace right mastoid effusion noted. Middle ear cavities are clear.

Skeleton: No acute osseous abnormality. No discrete lytic or blastic
osseous lesions. Mild multilevel cervical spondylosis, most
prominent at C4-5.

Upper chest: Visualized upper chest demonstrates no acute finding.
Partially visualized lungs are grossly clear.

Other: None.
IMPRESSION: 1. Findings consistent with acute right-sided
tonsillitis/pharyngitis, with superimposed 2.1 x 3.4 x 5.7 cm right
tonsillar/peritonsillar abscess as above.
2. Mildly prominent right-sided cervical adenopathy as above,
presumably reactive.

## 2022-12-26 ENCOUNTER — Encounter (HOSPITAL_COMMUNITY): Payer: Self-pay | Admitting: *Deleted

## 2022-12-26 ENCOUNTER — Emergency Department (HOSPITAL_COMMUNITY)
Admission: EM | Admit: 2022-12-26 | Discharge: 2022-12-26 | Disposition: A | Discharge status: Home / Self Care | Payer: No Typology Code available for payment source | Attending: Emergency Medicine | Admitting: Emergency Medicine

## 2022-12-26 ENCOUNTER — Other Ambulatory Visit: Payer: Self-pay

## 2022-12-26 DIAGNOSIS — S0990XA Unspecified injury of head, initial encounter: Secondary | ICD-10-CM | POA: Insufficient documentation

## 2022-12-26 DIAGNOSIS — W500XXA Accidental hit or strike by another person, initial encounter: Secondary | ICD-10-CM | POA: Diagnosis not present
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
## 2022-12-26 NOTE — Discharge Instructions (Addendum)
It was a pleasure caring for you today.  Physical exam is reassuring.  Please follow-up with your primary care provider.  Seek emergency care if experiencing any new or worsening symptoms.  Alternating between 650 mg Tylenol and 400 mg Advil: The best way to alternate taking Acetaminophen (example Tylenol) and Ibuprofen (example Advil/Motrin) is to take them 3 hours apart. For example, if you take ibuprofen at 6 am you can then take Tylenol at 9 am. You can continue this regimen throughout the day, making sure you do not exceed the recommended maximum dose for each drug.
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
## 2023-08-28 ENCOUNTER — Ambulatory Visit (HOSPITAL_COMMUNITY): Payer: Self-pay | Admitting: Licensed Clinical Social Worker
